# Patient Record
Sex: Female | Born: 2005 | Race: Black or African American | Marital: Single
Health system: Southern US, Community
[De-identification: ages and names within clinical notes are randomized; demographics above are authoritative.]

## PROBLEM LIST (undated history)

## (undated) DIAGNOSIS — F909 Attention-deficit hyperactivity disorder, unspecified type: Secondary | ICD-10-CM

## (undated) DIAGNOSIS — N63 Unspecified lump in unspecified breast: Secondary | ICD-10-CM

## (undated) HISTORY — DX: Unspecified lump in unspecified breast: N63.0

## (undated) HISTORY — DX: Attention-deficit hyperactivity disorder, unspecified type: F90.9

---
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
## 2024-01-29 DIAGNOSIS — J00 Acute nasopharyngitis [common cold]: Secondary | ICD-10-CM | POA: Diagnosis not present

## 2024-01-29 DIAGNOSIS — Z3202 Encounter for pregnancy test, result negative: Secondary | ICD-10-CM | POA: Diagnosis not present

## 2024-01-29 DIAGNOSIS — Z1152 Encounter for screening for COVID-19: Secondary | ICD-10-CM | POA: Diagnosis not present
